# Patient Record
Sex: Female | Born: 1940 | Hispanic: No | Marital: Single
Health system: Southern US, Community
[De-identification: ages and names within clinical notes are randomized; demographics above are authoritative.]

## PROBLEM LIST (undated history)

## (undated) DIAGNOSIS — J9621 Acute and chronic respiratory failure with hypoxia: Secondary | ICD-10-CM

## (undated) DIAGNOSIS — I609 Nontraumatic subarachnoid hemorrhage, unspecified: Secondary | ICD-10-CM

## (undated) DIAGNOSIS — J189 Pneumonia, unspecified organism: Secondary | ICD-10-CM

## (undated) DIAGNOSIS — Z930 Tracheostomy status: Secondary | ICD-10-CM

---

## 2019-11-09 ENCOUNTER — Other Ambulatory Visit (HOSPITAL_COMMUNITY): Payer: Medicaid Other

## 2019-11-09 ENCOUNTER — Encounter: Payer: Self-pay | Admitting: Internal Medicine

## 2019-11-09 ENCOUNTER — Inpatient Hospital Stay
Admission: EM | Admit: 2019-11-09 | Discharge: 2019-12-02 | Disposition: A | Discharge status: Skilled Nursing Facility | Payer: Medicaid Other | Source: Other Acute Inpatient Hospital | Attending: Internal Medicine | Admitting: Internal Medicine

## 2019-11-09 DIAGNOSIS — Z930 Tracheostomy status: Secondary | ICD-10-CM

## 2019-11-09 DIAGNOSIS — I609 Nontraumatic subarachnoid hemorrhage, unspecified: Secondary | ICD-10-CM

## 2019-11-09 DIAGNOSIS — R52 Pain, unspecified: Secondary | ICD-10-CM

## 2019-11-09 DIAGNOSIS — K9423 Gastrostomy malfunction: Secondary | ICD-10-CM

## 2019-11-09 DIAGNOSIS — J9621 Acute and chronic respiratory failure with hypoxia: Secondary | ICD-10-CM | POA: Diagnosis not present

## 2019-11-09 DIAGNOSIS — Z931 Gastrostomy status: Secondary | ICD-10-CM

## 2019-11-09 DIAGNOSIS — J189 Pneumonia, unspecified organism: Secondary | ICD-10-CM | POA: Diagnosis not present

## 2019-11-09 HISTORY — DX: Nontraumatic subarachnoid hemorrhage, unspecified: I60.9

## 2019-11-09 HISTORY — DX: Pneumonia, unspecified organism: J18.9

## 2019-11-09 HISTORY — DX: Tracheostomy status: Z93.0

## 2019-11-09 HISTORY — DX: Acute and chronic respiratory failure with hypoxia: J96.21

## 2019-11-09 LAB — URINALYSIS, ROUTINE W REFLEX MICROSCOPIC
Bacteria, UA: NONE SEEN
Bilirubin Urine: NEGATIVE
Glucose, UA: NEGATIVE mg/dL
Hgb urine dipstick: NEGATIVE
Ketones, ur: NEGATIVE mg/dL
Leukocytes,Ua: NEGATIVE
Nitrite: NEGATIVE
Protein, ur: 100 mg/dL — AB
Specific Gravity, Urine: 1.021 (ref 1.005–1.030)
pH: 7 (ref 5.0–8.0)

## 2019-11-09 LAB — CBC
HCT: 31.8 % — ABNORMAL LOW (ref 36.0–46.0)
Hemoglobin: 9.7 g/dL — ABNORMAL LOW (ref 12.0–15.0)
MCH: 30.5 pg (ref 26.0–34.0)
MCHC: 30.5 g/dL (ref 30.0–36.0)
MCV: 100 fL (ref 80.0–100.0)
Platelets: 339 10*3/uL (ref 150–400)
RBC: 3.18 MIL/uL — ABNORMAL LOW (ref 3.87–5.11)
RDW: 13.9 % (ref 11.5–15.5)
WBC: 7.5 10*3/uL (ref 4.0–10.5)
nRBC: 0 % (ref 0.0–0.2)

## 2019-11-09 LAB — COMPREHENSIVE METABOLIC PANEL
ALT: 69 U/L — ABNORMAL HIGH (ref 0–44)
AST: 60 U/L — ABNORMAL HIGH (ref 15–41)
Albumin: 2.1 g/dL — ABNORMAL LOW (ref 3.5–5.0)
Alkaline Phosphatase: 117 U/L (ref 38–126)
Anion gap: 8 (ref 5–15)
BUN: 25 mg/dL — ABNORMAL HIGH (ref 8–23)
CO2: 28 mmol/L (ref 22–32)
Calcium: 9.4 mg/dL (ref 8.9–10.3)
Chloride: 105 mmol/L (ref 98–111)
Creatinine, Ser: 0.56 mg/dL (ref 0.44–1.00)
GFR calc Af Amer: >60 mL/min (ref >60)
GFR calc non Af Amer: >60 mL/min (ref >60)
Glucose, Bld: 108 mg/dL — ABNORMAL HIGH (ref 70–99)
Potassium: 4.3 mmol/L (ref 3.5–5.1)
Sodium: 141 mmol/L (ref 135–145)
Total Bilirubin: 0.6 mg/dL (ref 0.3–1.2)
Total Protein: 5.4 g/dL — ABNORMAL LOW (ref 6.5–8.1)

## 2019-11-09 LAB — PROTIME-INR
INR: 1.1 (ref 0.8–1.2)
Prothrombin Time: 13.9 seconds (ref 11.4–15.2)

## 2019-11-09 LAB — APTT: aPTT: 31 seconds (ref 24–36)

## 2019-11-09 LAB — TSH: TSH: 0.63 u[IU]/mL (ref 0.350–4.500)

## 2019-11-09 MED ORDER — IOHEXOL 300 MG/ML  SOLN
50.0000 mL | Freq: Once | INTRAMUSCULAR | Status: AC | PRN
  Administered 2019-11-09: 50 mL

## 2019-11-09 NOTE — Consult Note (Signed)
Pulmonary Critical Care Medicine [facility] [place]  PULMONARY SERVICE  Date of Service: 11/09/2019  PULMONARY CRITICAL CARE CONSULT   Stacy Dawson  NAT:[id number]  DOB: 09/29/1941   DOA: 11/09/2019  Referring Physician: Carron Curie, MD  HPI: Stacy Dawson is a 79 y.o. female seen for follow up of Acute on Chronic Respiratory Failure.  Patient has multiple medical problems including respiratory failure chronic anemia blindness depression hypertension hyperlipidemia stroke came into the hospital because of mental status changes.  The patient and eventually was found to have a subarachnoid hemorrhage.  She underwent coiling and embolization.  Patient also received a VP shunt for increased intracranial pressure.  Postprocedure she was noted to be wean off the ventilator.  Patient ended up having to have a tracheostomy done and has since then been slowly advancing on the wean to T-bar.  Patient because of the anticipated prolonged tracheostomy was transferred to this facility for further management.  Other issues at the other facility including uncontrolled high blood pressure along with the subarachnoid hemorrhage which has been requiring labetalol.  Now appears to be doing a little bit better.  Review of Systems:  ROS performed and is unremarkable other than noted above.  Past medical history: Legally blind in the left eye Depression Anemia Hyperlipidemia Stroke Hypertension  Past surgical history:  Cerebral embolization Tracheostomy  Social history: Unknown smoker alcohol  Family history: Unknown  Allergies: Reviewed includes Benadryl Iodine Latex  Medications: Reviewed on Rounds  Physical Exam:  Vitals: Temperature 99.2 pulse 83 respiratory 26 blood pressure is 111/52 saturations 95%  Ventilator Settings on T collar with an FiO2 of 35%  . General: Comfortable at this time . Eyes: Grossly normal lids, irises & conjunctiva . ENT: grossly tongue is  normal . Neck: no obvious mass . Cardiovascular: S1-S2 normal no gallop or rub . Respiratory: Scattered rhonchi expansion is equal . Abdomen: Soft and nontender . Skin: no rash seen on limited exam . Musculoskeletal: not rigid . Psychiatric:unable to assess . Neurologic: no seizure no involuntary movements         Labs on Admission:  Basic Metabolic Panel: Recent Labs  Lab 11/09/19 0839  NA 141  K 4.3  CL 105  CO2 28  GLUCOSE 108*  BUN 25*  CREATININE 0.56  CALCIUM 9.4    No results for input(s): PHART, PCO2ART, PO2ART, HCO3, O2SAT in the last 168 hours.  Liver Function Tests: Recent Labs  Lab 11/09/19 0839  AST 60*  ALT 69*  ALKPHOS 117  BILITOT 0.6  PROT 5.4*  ALBUMIN 2.1*   No results for input(s): LIPASE, AMYLASE in the last 168 hours. No results for input(s): AMMONIA in the last 168 hours.  CBC: Recent Labs  Lab 11/09/19 0839  WBC 7.5  HGB 9.7*  HCT 31.8*  MCV 100.0  PLT 339    Cardiac Enzymes: No results for input(s): CKTOTAL, CKMB, CKMBINDEX, TROPONINI in the last 168 hours.  BNP (last 3 results) No results for input(s): BNP in the last 8760 hours.  ProBNP (last 3 results) No results for input(s): PROBNP in the last 8760 hours.   Radiological Exams on Admission: DG ABDOMEN PEG TUBE LOCATION  Result Date: 11/09/2019 CLINICAL DATA:  Check position of gastrostomy post administration 50 mL Omnipaque 300 EXAM: ABDOMEN - 1 VIEW COMPARISON:  None FINDINGS: Contrast is in the decompressed stomach and proximal duodenum. No evidence of extravasation. Gastrostomy tube projects in expected location. Cholecystectomy clips. Lower lumbar fixation hardware. Normal  bowel gas pattern. Presumed VP shunt tubing coils in the right lateral abdomen. IMPRESSION: Gastrostomy tube in expected location.  No extravasation. Electronically Signed   By: Lucrezia Europe M.D.   On: 11/09/2019 07:24   DG Chest Port 1 View  Result Date: 11/09/2019 CLINICAL DATA:   Trach,respiratory failure^52mL OMNIPAQUE IOHEXOL 300 MG/ML SOLN EXAM: PORTABLE CHEST - 1 VIEW COMPARISON:  none FINDINGS: Tracheostomy tip 3.8 cm above carina. Right arm PICC to the cavoatrial junction. Presumed VP shunt tubing projects over the right hemithorax. Low lung volumes. Patchy interstitial and airspace opacities in the lung bases left greater than right. Heart size upper limits normal for technique. Blunting of the right lateral costophrenic angle. No pneumothorax. Left shoulder arthroplasty hardware partially visualized. Fracture deformity of the proximal right humerus. IMPRESSION: Bibasilar opacities left greater than right, with possible small right pleural effusion. Electronically Signed   By: Lucrezia Europe M.D.   On: 11/09/2019 07:26    Assessment/Plan Active Problems:   Acute on chronic respiratory failure with hypoxia (HCC)   Healthcare-associated pneumonia   Nontraumatic subarachnoid hemorrhage ([place])   Tracheostomy status ([place])   1. Acute on chronic respiratory failure with hypoxia plan is going to be to continue with weaning on T collar.  Patient secretions are fair to moderate will be continued to be monitored. 2. Possible healthcare associated pneumonia patient has basilar opacities noted on the last chest x-ray along with trace effusions we will continue to watch this closely she does have a low-grade fever noted also. 3. Subarachnoid hemorrhage status post coil embolization we will continue with supportive care will need physical therapy as tolerated. 4. Tracheostomy status right now she is not responsive and not sure if we can be able to be able to fully decannulate this patient will need to monitor see how she does during the course stay.  I have personally seen and evaluated the patient, evaluated laboratory and imaging results, formulated the assessment and plan and placed orders. The Patient requires high complexity decision making with multiple systems involvement.  Case  was discussed on Rounds with the Respiratory Therapy Director and the Respiratory staff Time Spent 54minutes  Jaser Fullen A Tanga Gloor, MD [facility] Pulmonary Critical Care Medicine Sleep Medicine

## 2019-11-10 DIAGNOSIS — I609 Nontraumatic subarachnoid hemorrhage, unspecified: Secondary | ICD-10-CM | POA: Diagnosis not present

## 2019-11-10 DIAGNOSIS — Z930 Tracheostomy status: Secondary | ICD-10-CM | POA: Diagnosis not present

## 2019-11-10 DIAGNOSIS — J9621 Acute and chronic respiratory failure with hypoxia: Secondary | ICD-10-CM | POA: Diagnosis not present

## 2019-11-10 DIAGNOSIS — J189 Pneumonia, unspecified organism: Secondary | ICD-10-CM | POA: Diagnosis not present

## 2019-11-10 NOTE — Progress Notes (Signed)
Pulmonary Critical Care Medicine [facility] [place]   PULMONARY CRITICAL CARE SERVICE  PROGRESS NOTE  Date of Service: 11/10/2019  Stacy Dawson  TMA:[id number]  DOB: Dec 02, 1940   DOA: 11/09/2019  Referring Physician: Carron Curie, MD  HPI: Stacy Dawson is a 79 y.o. female seen for follow up of Acute on Chronic Respiratory Failure.  Patient right now is on T collar has been on 28% FiO2 has moderate amount of secretions due for trach change also today  Medications: Reviewed on Rounds  Physical Exam:  Vitals: Temperature 98.4 pulse 87 respiratory rate 26 blood pressure is 138/81 saturations 93%  Ventilator Settings on T collar FiO2 is 28%  . General: Comfortable at this time . Eyes: Grossly normal lids, irises & conjunctiva . ENT: grossly tongue is normal . Neck: no obvious mass . Cardiovascular: S1 S2 normal no gallop . Respiratory: Scattered rhonchi expansion is equal . Abdomen: soft . Skin: no rash seen on limited exam . Musculoskeletal: not rigid . Psychiatric:unable to assess . Neurologic: no seizure no involuntary movements         Lab Data:   Basic Metabolic Panel: Recent Labs  Lab 11/09/19 0839  NA 141  K 4.3  CL 105  CO2 28  GLUCOSE 108*  BUN 25*  CREATININE 0.56  CALCIUM 9.4    ABG: No results for input(s): PHART, PCO2ART, PO2ART, HCO3, O2SAT in the last 168 hours.  Liver Function Tests: Recent Labs  Lab 11/09/19 0839  AST 60*  ALT 69*  ALKPHOS 117  BILITOT 0.6  PROT 5.4*  ALBUMIN 2.1*   No results for input(s): LIPASE, AMYLASE in the last 168 hours. No results for input(s): AMMONIA in the last 168 hours.  CBC: Recent Labs  Lab 11/09/19 0839  WBC 7.5  HGB 9.7*  HCT 31.8*  MCV 100.0  PLT 339    Cardiac Enzymes: No results for input(s): CKTOTAL, CKMB, CKMBINDEX, TROPONINI in the last 168 hours.  BNP (last 3 results) No results for input(s): BNP in the last 8760 hours.  ProBNP (last 3 results) No results for  input(s): PROBNP in the last 8760 hours.  Radiological Exams: DG ABDOMEN PEG TUBE LOCATION  Result Date: 11/09/2019 CLINICAL DATA:  Check position of gastrostomy post administration 50 mL Omnipaque 300 EXAM: ABDOMEN - 1 VIEW COMPARISON:  None FINDINGS: Contrast is in the decompressed stomach and proximal duodenum. No evidence of extravasation. Gastrostomy tube projects in expected location. Cholecystectomy clips. Lower lumbar fixation hardware. Normal bowel gas pattern. Presumed VP shunt tubing coils in the right lateral abdomen. IMPRESSION: Gastrostomy tube in expected location.  No extravasation. Electronically Signed   By: Corlis Leak M.D.   On: 11/09/2019 07:24   DG Chest Port 1 View  Result Date: 11/09/2019 CLINICAL DATA:  Trach,respiratory failure^56mL OMNIPAQUE IOHEXOL 300 MG/ML SOLN EXAM: PORTABLE CHEST - 1 VIEW COMPARISON:  none FINDINGS: Tracheostomy tip 3.8 cm above carina. Right arm PICC to the cavoatrial junction. Presumed VP shunt tubing projects over the right hemithorax. Low lung volumes. Patchy interstitial and airspace opacities in the lung bases left greater than right. Heart size upper limits normal for technique. Blunting of the right lateral costophrenic angle. No pneumothorax. Left shoulder arthroplasty hardware partially visualized. Fracture deformity of the proximal right humerus. IMPRESSION: Bibasilar opacities left greater than right, with possible small right pleural effusion. Electronically Signed   By: Corlis Leak M.D.   On: 11/09/2019 07:26    Assessment/Plan Active Problems:   Acute on  chronic respiratory failure with hypoxia (HCC)   Healthcare-associated pneumonia   Nontraumatic subarachnoid hemorrhage ([place])   Tracheostomy status ([place])   1. Acute on chronic respiratory failure with hypoxia patient right now is on T collar good saturations.  Noted however has moderate amount of secretions.  As discussed above plan is to go ahead and change the trach out to a cuffless  trach. 2. Healthcare associated pneumonia treated slowly improving chest x-ray reviewed showing bibasilar opacities which we need to continue to follow 3. Nontraumatic subarachnoid hemorrhage no improvement 4. Tracheostomy continue with supportive care and change out today as already mentioned above   I have personally seen and evaluated the patient, evaluated laboratory and imaging results, formulated the assessment and plan and placed orders. The Patient requires high complexity decision making with multiple systems involvement.  Rounds were done with the Respiratory Therapy Director and Staff therapists and discussed with nursing staff also.  Allyne Gee, MD [facility] Pulmonary Critical Care Medicine Sleep Medicine

## 2019-11-11 DIAGNOSIS — J9621 Acute and chronic respiratory failure with hypoxia: Secondary | ICD-10-CM | POA: Diagnosis not present

## 2019-11-11 DIAGNOSIS — J189 Pneumonia, unspecified organism: Secondary | ICD-10-CM | POA: Diagnosis not present

## 2019-11-11 DIAGNOSIS — I609 Nontraumatic subarachnoid hemorrhage, unspecified: Secondary | ICD-10-CM | POA: Diagnosis not present

## 2019-11-11 DIAGNOSIS — Z930 Tracheostomy status: Secondary | ICD-10-CM | POA: Diagnosis not present

## 2019-11-11 NOTE — Progress Notes (Addendum)
Pulmonary Critical Care Medicine [facility] [place]   PULMONARY CRITICAL CARE SERVICE  PROGRESS NOTE  Date of Service: 11/11/2019  [name]  PFX:[id number]  DOB: May 14, 1941   DOA: 11/09/2019  Referring Physician: Carron Curie, MD  HPI: Stacy Dawson is a 79 y.o. female seen for follow up of Acute on Chronic Respiratory Failure.  Patient is on 20% aerosol trach collar satting well no fever distress.  Medications: Reviewed on Rounds  Physical Exam:  Vitals: Pulse 80 respirations 22 BP 112/82 O2 sat 98% temp 98.1  Ventilator Settings ATC 28%  . General: Comfortable at this time . Eyes: Grossly normal lids, irises & conjunctiva . ENT: grossly tongue is normal . Neck: no obvious mass . Cardiovascular: S1 S2 normal no gallop . Respiratory: No rales or rhonchi noted . Abdomen: soft . Skin: no rash seen on limited exam . Musculoskeletal: not rigid . Psychiatric:unable to assess . Neurologic: no seizure no involuntary movements         Lab Data:   Basic Metabolic Panel: Recent Labs  Lab 11/09/19 0839  NA 141  K 4.3  CL 105  CO2 28  GLUCOSE 108*  BUN 25*  CREATININE 0.56  CALCIUM 9.4    ABG: No results for input(s): PHART, PCO2ART, PO2ART, HCO3, O2SAT in the last 168 hours.  Liver Function Tests: Recent Labs  Lab 11/09/19 0839  AST 60*  ALT 69*  ALKPHOS 117  BILITOT 0.6  PROT 5.4*  ALBUMIN 2.1*   No results for input(s): LIPASE, AMYLASE in the last 168 hours. No results for input(s): AMMONIA in the last 168 hours.  CBC: Recent Labs  Lab 11/09/19 0839  WBC 7.5  HGB 9.7*  HCT 31.8*  MCV 100.0  PLT 339    Cardiac Enzymes: No results for input(s): CKTOTAL, CKMB, CKMBINDEX, TROPONINI in the last 168 hours.  BNP (last 3 results) No results for input(s): BNP in the last 8760 hours.  ProBNP (last 3 results) No results for input(s): PROBNP in the last 8760 hours.  Radiological Exams: No results found.  Assessment/Plan Active  Problems:   Acute on chronic respiratory failure with hypoxia (HCC)   Healthcare-associated pneumonia   Nontraumatic subarachnoid hemorrhage (HCC)   Tracheostomy status (HCC)   1. Acute on chronic respiratory failure with hypoxia patient continues on 20% aerosol trach collar well no fever continue supportive measures pulmonary toilet at this time. 2. Healthcare associated pneumonia treated slowly improving chest x-ray reviewed showing bibasilar opacities which we need to continue to follow 3. Nontraumatic subarachnoid hemorrhage no improvement 4. Tracheostomy continue with supportive care and change out today as already mentioned above   I have personally seen and evaluated the patient, evaluated laboratory and imaging results, formulated the assessment and plan and placed orders. The Patient requires high complexity decision making with multiple systems involvement.  Rounds were done with the Respiratory Therapy Director and Staff therapists and discussed with nursing staff also.  Yevonne Pax, MD [facility] Pulmonary Critical Care Medicine Sleep Medicine

## 2019-11-12 DIAGNOSIS — Z930 Tracheostomy status: Secondary | ICD-10-CM | POA: Diagnosis not present

## 2019-11-12 DIAGNOSIS — J9621 Acute and chronic respiratory failure with hypoxia: Secondary | ICD-10-CM | POA: Diagnosis not present

## 2019-11-12 DIAGNOSIS — I609 Nontraumatic subarachnoid hemorrhage, unspecified: Secondary | ICD-10-CM | POA: Diagnosis not present

## 2019-11-12 DIAGNOSIS — J189 Pneumonia, unspecified organism: Secondary | ICD-10-CM | POA: Diagnosis not present

## 2019-11-12 NOTE — Progress Notes (Addendum)
Pulmonary Critical Care Medicine [facility] [place]   PULMONARY CRITICAL CARE SERVICE  PROGRESS NOTE  Date of Service: 11/12/2019  Stacy Dawson  OVZ:[id number]  DOB: 03/23/1941   DOA: 11/09/2019  Referring Physician: Carron Curie, MD  HPI: Stacy Dawson is a 79 y.o. female seen for follow up of Acute on Chronic Respiratory Failure.  Patient is on 20% aerosol trach collar satting well no fever distress.  Medications: Reviewed on Rounds  Physical Exam:  Vitals: Pulse 79 respirations 18 BP 160/74 O2 sat 98% temp 97.1  Ventilator Settings ATC 28%  . General: Comfortable at this time . Eyes: Grossly normal lids, irises & conjunctiva . ENT: grossly tongue is normal . Neck: no obvious mass . Cardiovascular: S1 S2 normal no gallop . Respiratory: No rales rhonchi noted . Abdomen: soft . Skin: no rash seen on limited exam . Musculoskeletal: not rigid . Psychiatric:unable to assess . Neurologic: no seizure no involuntary movements         Lab Data:   Basic Metabolic Panel: Recent Labs  Lab 11/09/19 0839  NA 141  K 4.3  CL 105  CO2 28  GLUCOSE 108*  BUN 25*  CREATININE 0.56  CALCIUM 9.4    ABG: No results for input(s): PHART, PCO2ART, PO2ART, HCO3, O2SAT in the last 168 hours.  Liver Function Tests: Recent Labs  Lab 11/09/19 0839  AST 60*  ALT 69*  ALKPHOS 117  BILITOT 0.6  PROT 5.4*  ALBUMIN 2.1*   No results for input(s): LIPASE, AMYLASE in the last 168 hours. No results for input(s): AMMONIA in the last 168 hours.  CBC: Recent Labs  Lab 11/09/19 0839  WBC 7.5  HGB 9.7*  HCT 31.8*  MCV 100.0  PLT 339    Cardiac Enzymes: No results for input(s): CKTOTAL, CKMB, CKMBINDEX, TROPONINI in the last 168 hours.  BNP (last 3 results) No results for input(s): BNP in the last 8760 hours.  ProBNP (last 3 results) No results for input(s): PROBNP in the last 8760 hours.  Radiological Exams: No results found.  Assessment/Plan Active  Problems:   Acute on chronic respiratory failure with hypoxia (HCC)   Healthcare-associated pneumonia   Nontraumatic subarachnoid hemorrhage (HCC)   Tracheostomy status (HCC)   1. Acute on chronic respiratory failure with hypoxia patient continues on 20% aerosol trach collar well no fever continue supportive measures pulmonary toilet at this time. 2. Healthcare associated pneumonia treated slowly improving chest x-ray reviewed showing bibasilar opacities which we need to continue to follow 3. Nontraumatic subarachnoid hemorrhage no improvement 4. Tracheostomy continue with supportive care and change out today as already mentioned above   I have personally seen and evaluated the patient, evaluated laboratory and imaging results, formulated the assessment and plan and placed orders. The Patient requires high complexity decision making with multiple systems involvement.  Rounds were done with the Respiratory Therapy Director and Staff therapists and discussed with nursing staff also.  Yevonne Pax, MD [facility] Pulmonary Critical Care Medicine Sleep Medicine

## 2019-11-13 DIAGNOSIS — J189 Pneumonia, unspecified organism: Secondary | ICD-10-CM | POA: Diagnosis not present

## 2019-11-13 DIAGNOSIS — J9621 Acute and chronic respiratory failure with hypoxia: Secondary | ICD-10-CM | POA: Diagnosis not present

## 2019-11-13 DIAGNOSIS — I609 Nontraumatic subarachnoid hemorrhage, unspecified: Secondary | ICD-10-CM | POA: Diagnosis not present

## 2019-11-13 DIAGNOSIS — Z930 Tracheostomy status: Secondary | ICD-10-CM | POA: Diagnosis not present

## 2019-11-13 NOTE — Progress Notes (Addendum)
Pulmonary Critical Care Medicine [facility] [place]   PULMONARY CRITICAL CARE SERVICE  PROGRESS NOTE  Date of Service: 11/13/2019  Stacy Dawson  YTK:[id number]  DOB: 1941-04-28   DOA: 11/09/2019  Referring Physician: Carron Curie, MD  HPI: Stacy Dawson is a 79 y.o. female seen for follow up of Acute on Chronic Respiratory Failure.  Patient remains on aerosol trach collar 28% satting well using PMV.  Medications: Reviewed on Rounds  Physical Exam:  Vitals: Pulse 85 respirations 22 BP 135/70 O2 sat 97% temp 3  Ventilator Settings 28% ATC  . General: Comfortable at this time . Eyes: Grossly normal lids, irises & conjunctiva . ENT: grossly tongue is normal . Neck: no obvious mass . Cardiovascular: S1 S2 normal no gallop . Respiratory: No rales or rhonchi noted . Abdomen: soft . Skin: no rash seen on limited exam . Musculoskeletal: not rigid . Psychiatric:unable to assess . Neurologic: no seizure no involuntary movements         Lab Data:   Basic Metabolic Panel: Recent Labs  Lab 11/09/19 0839  NA 141  K 4.3  CL 105  CO2 28  GLUCOSE 108*  BUN 25*  CREATININE 0.56  CALCIUM 9.4    ABG: No results for input(s): PHART, PCO2ART, PO2ART, HCO3, O2SAT in the last 168 hours.  Liver Function Tests: Recent Labs  Lab 11/09/19 0839  AST 60*  ALT 69*  ALKPHOS 117  BILITOT 0.6  PROT 5.4*  ALBUMIN 2.1*   No results for input(s): LIPASE, AMYLASE in the last 168 hours. No results for input(s): AMMONIA in the last 168 hours.  CBC: Recent Labs  Lab 11/09/19 0839  WBC 7.5  HGB 9.7*  HCT 31.8*  MCV 100.0  PLT 339    Cardiac Enzymes: No results for input(s): CKTOTAL, CKMB, CKMBINDEX, TROPONINI in the last 168 hours.  BNP (last 3 results) No results for input(s): BNP in the last 8760 hours.  ProBNP (last 3 results) No results for input(s): PROBNP in the last 8760 hours.  Radiological Exams: No results found.  Assessment/Plan Active  Problems:   Acute on chronic respiratory failure with hypoxia (HCC)   Healthcare-associated pneumonia   Nontraumatic subarachnoid hemorrhage (HCC)   Tracheostomy status (HCC)   1. Acute on chronic respiratory failure with hypoxia patient continues on 28% aerosol trach collar well no fever continue supportive measures pulmonary toilet at this time. 2. Healthcare associated pneumonia treated slowly improving chest x-ray reviewed showing bibasilar opacities which we need to continue to follow 3. Nontraumatic subarachnoid hemorrhage no improvement 4. Tracheostomy continue with supportive care    I have personally seen and evaluated the patient, evaluated laboratory and imaging results, formulated the assessment and plan and placed orders. The Patient requires high complexity decision making with multiple systems involvement.  Rounds were done with the Respiratory Therapy Director and Staff therapists and discussed with nursing staff also.  Yevonne Pax, MD [facility] Pulmonary Critical Care Medicine Sleep Medicine

## 2019-11-14 DIAGNOSIS — Z930 Tracheostomy status: Secondary | ICD-10-CM | POA: Diagnosis not present

## 2019-11-14 DIAGNOSIS — J9621 Acute and chronic respiratory failure with hypoxia: Secondary | ICD-10-CM | POA: Diagnosis not present

## 2019-11-14 DIAGNOSIS — J189 Pneumonia, unspecified organism: Secondary | ICD-10-CM | POA: Diagnosis not present

## 2019-11-14 DIAGNOSIS — I609 Nontraumatic subarachnoid hemorrhage, unspecified: Secondary | ICD-10-CM | POA: Diagnosis not present

## 2019-11-14 LAB — CBC
HCT: 33.3 % — ABNORMAL LOW (ref 36.0–46.0)
Hemoglobin: 10.5 g/dL — ABNORMAL LOW (ref 12.0–15.0)
MCH: 29.8 pg (ref 26.0–34.0)
MCHC: 31.5 g/dL (ref 30.0–36.0)
MCV: 94.6 fL (ref 80.0–100.0)
Platelets: 436 10*3/uL — ABNORMAL HIGH (ref 150–400)
RBC: 3.52 MIL/uL — ABNORMAL LOW (ref 3.87–5.11)
RDW: 13.7 % (ref 11.5–15.5)
WBC: 7.3 10*3/uL (ref 4.0–10.5)
nRBC: 0 % (ref 0.0–0.2)

## 2019-11-14 LAB — COMPREHENSIVE METABOLIC PANEL
ALT: 68 U/L — ABNORMAL HIGH (ref 0–44)
AST: 44 U/L — ABNORMAL HIGH (ref 15–41)
Albumin: 2.3 g/dL — ABNORMAL LOW (ref 3.5–5.0)
Alkaline Phosphatase: 125 U/L (ref 38–126)
Anion gap: 12 (ref 5–15)
BUN: 15 mg/dL (ref 8–23)
CO2: 26 mmol/L (ref 22–32)
Calcium: 9.6 mg/dL (ref 8.9–10.3)
Chloride: 100 mmol/L (ref 98–111)
Creatinine, Ser: 0.54 mg/dL (ref 0.44–1.00)
GFR calc Af Amer: >60 mL/min (ref >60)
GFR calc non Af Amer: >60 mL/min (ref >60)
Glucose, Bld: 115 mg/dL — ABNORMAL HIGH (ref 70–99)
Potassium: 4 mmol/L (ref 3.5–5.1)
Sodium: 138 mmol/L (ref 135–145)
Total Bilirubin: 0.3 mg/dL (ref 0.3–1.2)
Total Protein: 5.3 g/dL — ABNORMAL LOW (ref 6.5–8.1)

## 2019-11-14 NOTE — Progress Notes (Signed)
Pulmonary Critical Care Medicine [facility] [place]   PULMONARY CRITICAL CARE SERVICE  PROGRESS NOTE  Date of Service: 11/14/2019  Stacy Dawson  [id number]:[id number]  DOB: 03-27-1941   DOA: 11/09/2019  Referring Physician: Carron Curie, MD  HPI: Stacy Dawson is a 79 y.o. female seen for follow up of Acute on Chronic Respiratory Failure.  Patient currently is on T collar has been on 28% FiO2 has been using the PMV without any distress at this time  Medications: Reviewed on Rounds  Physical Exam:  Vitals: Temperature is 97.2 pulse 87 respiratory rate 22 blood pressure is 153/98 saturations 97%  Ventilator Settings on T collar with an FiO2 of 28%  . General: Comfortable at this time . Eyes: Grossly normal lids, irises & conjunctiva . ENT: grossly tongue is normal . Neck: no obvious mass . Cardiovascular: S1 S2 normal no gallop . Respiratory: No rhonchi no rales are noted at this time . Abdomen: soft . Skin: no rash seen on limited exam . Musculoskeletal: not rigid . Psychiatric:unable to assess . Neurologic: no seizure no involuntary movements         Lab Data:   Basic Metabolic Panel: Recent Labs  Lab 11/09/19 0839 11/14/19 0720  NA 141 138  K 4.3 4.0  CL 105 100  CO2 28 26  GLUCOSE 108* 115*  BUN 25* 15  CREATININE 0.56 0.54  CALCIUM 9.4 9.6    ABG: No results for input(s): PHART, PCO2ART, PO2ART, HCO3, O2SAT in the last 168 hours.  Liver Function Tests: Recent Labs  Lab 11/09/19 0839 11/14/19 0720  AST 60* 44*  ALT 69* 68*  ALKPHOS 117 125  BILITOT 0.6 0.3  PROT 5.4* 5.3*  ALBUMIN 2.1* 2.3*   No results for input(s): LIPASE, AMYLASE in the last 168 hours. No results for input(s): AMMONIA in the last 168 hours.  CBC: Recent Labs  Lab 11/09/19 0839 11/14/19 0720  WBC 7.5 7.3  HGB 9.7* 10.5*  HCT 31.8* 33.3*  MCV 100.0 94.6  PLT 339 436*    Cardiac Enzymes: No results for input(s): CKTOTAL, CKMB, CKMBINDEX, TROPONINI in the  last 168 hours.  BNP (last 3 results) No results for input(s): BNP in the last 8760 hours.  ProBNP (last 3 results) No results for input(s): PROBNP in the last 8760 hours.  Radiological Exams: No results found.  Assessment/Plan Active Problems:   Acute on chronic respiratory failure with hypoxia (HCC)   Healthcare-associated pneumonia   Nontraumatic subarachnoid hemorrhage (HCC)   Tracheostomy status (HCC)   1. Acute on chronic respiratory failure with hypoxia plan is to continue with weaning on T collar continue using the PMV. 2. Healthcare associated pneumonia treated we will continue to monitor. 3. Nontraumatic intracranial hemorrhage no change 4. Tracheostomy remains in place tolerating PMV   I have personally seen and evaluated the patient, evaluated laboratory and imaging results, formulated the assessment and plan and placed orders. The Patient requires high complexity decision making with multiple systems involvement.  Rounds were done with the Respiratory Therapy Director and Staff therapists and discussed with nursing staff also.  Yevonne Pax, MD [facility] Pulmonary Critical Care Medicine Sleep Medicine

## 2019-11-15 DIAGNOSIS — I609 Nontraumatic subarachnoid hemorrhage, unspecified: Secondary | ICD-10-CM | POA: Diagnosis not present

## 2019-11-15 DIAGNOSIS — J9621 Acute and chronic respiratory failure with hypoxia: Secondary | ICD-10-CM | POA: Diagnosis not present

## 2019-11-15 DIAGNOSIS — Z930 Tracheostomy status: Secondary | ICD-10-CM | POA: Diagnosis not present

## 2019-11-15 DIAGNOSIS — J189 Pneumonia, unspecified organism: Secondary | ICD-10-CM | POA: Diagnosis not present

## 2019-11-15 NOTE — Progress Notes (Signed)
Pulmonary Critical Care Medicine [facility] [place]   PULMONARY CRITICAL CARE SERVICE  PROGRESS NOTE  Date of Service: 11/15/2019  Stacy Dawson  [id number]:[id number]  DOB: 1941-05-09   DOA: 11/09/2019  Referring Physician: Carron Curie, MD  HPI: Stacy Dawson is a 79 y.o. female seen for follow up of Acute on Chronic Respiratory Failure.  Patient currently is on T collar has been on 28% FiO2 using PMV without any distress at this time  Medications: Reviewed on Rounds  Physical Exam:  Vitals: Temperature is 97.6 pulse 84 respiratory rate 19 blood pressure is 145/50 saturations 100%  Ventilator Settings on T collar with an FiO2 of 28% using PMV  . General: Comfortable at this time . Eyes: Grossly normal lids, irises & conjunctiva . ENT: grossly tongue is normal . Neck: no obvious mass . Cardiovascular: S1 S2 normal no gallop . Respiratory: Coarse rhonchi expansion is equal . Abdomen: soft . Skin: no rash seen on limited exam . Musculoskeletal: not rigid . Psychiatric:unable to assess . Neurologic: no seizure no involuntary movements         Lab Data:   Basic Metabolic Panel: Recent Labs  Lab 11/09/19 0839 11/14/19 0720  NA 141 138  K 4.3 4.0  CL 105 100  CO2 28 26  GLUCOSE 108* 115*  BUN 25* 15  CREATININE 0.56 0.54  CALCIUM 9.4 9.6    ABG: No results for input(s): PHART, PCO2ART, PO2ART, HCO3, O2SAT in the last 168 hours.  Liver Function Tests: Recent Labs  Lab 11/09/19 0839 11/14/19 0720  AST 60* 44*  ALT 69* 68*  ALKPHOS 117 125  BILITOT 0.6 0.3  PROT 5.4* 5.3*  ALBUMIN 2.1* 2.3*   No results for input(s): LIPASE, AMYLASE in the last 168 hours. No results for input(s): AMMONIA in the last 168 hours.  CBC: Recent Labs  Lab 11/09/19 0839 11/14/19 0720  WBC 7.5 7.3  HGB 9.7* 10.5*  HCT 31.8* 33.3*  MCV 100.0 94.6  PLT 339 436*    Cardiac Enzymes: No results for input(s): CKTOTAL, CKMB, CKMBINDEX, TROPONINI in the last 168  hours.  BNP (last 3 results) No results for input(s): BNP in the last 8760 hours.  ProBNP (last 3 results) No results for input(s): PROBNP in the last 8760 hours.  Radiological Exams: No results found.  Assessment/Plan Active Problems:   Acute on chronic respiratory failure with hypoxia (HCC)   Healthcare-associated pneumonia   Nontraumatic subarachnoid hemorrhage (HCC)   Tracheostomy status (HCC)   1. Acute on chronic respiratory failure with hypoxia plan is to continue with T collar trials titrate oxygen as tolerated patient has been using the PMV 2. Healthcare associated pneumonia slow improvement we will continue to follow 3. Nontraumatic subarachnoid hemorrhage no change 4. Tracheostomy in place   I have personally seen and evaluated the patient, evaluated laboratory and imaging results, formulated the assessment and plan and placed orders. The Patient requires high complexity decision making with multiple systems involvement.  Rounds were done with the Respiratory Therapy Director and Staff therapists and discussed with nursing staff also.  Yevonne Pax, MD [facility] Pulmonary Critical Care Medicine Sleep Medicine

## 2019-11-16 DIAGNOSIS — J9621 Acute and chronic respiratory failure with hypoxia: Secondary | ICD-10-CM | POA: Diagnosis not present

## 2019-11-16 DIAGNOSIS — I609 Nontraumatic subarachnoid hemorrhage, unspecified: Secondary | ICD-10-CM | POA: Diagnosis not present

## 2019-11-16 DIAGNOSIS — J189 Pneumonia, unspecified organism: Secondary | ICD-10-CM | POA: Diagnosis not present

## 2019-11-16 DIAGNOSIS — Z930 Tracheostomy status: Secondary | ICD-10-CM | POA: Diagnosis not present

## 2019-11-16 NOTE — Progress Notes (Signed)
Pulmonary Critical Care Medicine [facility] [place]   PULMONARY CRITICAL CARE SERVICE  PROGRESS NOTE  Date of Service: 11/16/2019  Stacy Dawson  [id number]  DOB: 12-Mar-1941   DOA: 11/09/2019  Referring Physician: Carron Curie, MD  HPI: Stacy Dawson is a 79 y.o. female seen for follow up of Acute on Chronic Respiratory Failure.  Patient is currently on T collar has been on 28% FiO2 using PMV without any major distress noted at this time  Medications: Reviewed on Rounds  Physical Exam:  Vitals: Temperature is 97.6 pulse 83 respiratory 19 blood pressure is 145/58 saturations 99%  Ventilator Settings on T collar with an FiO2 of 28% at this time  . General: Comfortable at this time . Eyes: Grossly normal lids, irises & conjunctiva . ENT: grossly tongue is normal . Neck: no obvious mass . Cardiovascular: S1 S2 normal no gallop . Respiratory: No rhonchi no rales are noted at this time . Abdomen: soft . Skin: no rash seen on limited exam . Musculoskeletal: not rigid . Psychiatric:unable to assess . Neurologic: no seizure no involuntary movements         Lab Data:   Basic Metabolic Panel: Recent Labs  Lab 11/14/19 0720  NA 138  K 4.0  CL 100  CO2 26  GLUCOSE 115*  BUN 15  CREATININE 0.54  CALCIUM 9.6    ABG: No results for input(s): PHART, PCO2ART, PO2ART, HCO3, O2SAT in the last 168 hours.  Liver Function Tests: Recent Labs  Lab 11/14/19 0720  AST 44*  ALT 68*  ALKPHOS 125  BILITOT 0.3  PROT 5.3*  ALBUMIN 2.3*   No results for input(s): LIPASE, AMYLASE in the last 168 hours. No results for input(s): AMMONIA in the last 168 hours.  CBC: Recent Labs  Lab 11/14/19 0720  WBC 7.3  HGB 10.5*  HCT 33.3*  MCV 94.6  PLT 436*    Cardiac Enzymes: No results for input(s): CKTOTAL, CKMB, CKMBINDEX, TROPONINI in the last 168 hours.  BNP (last 3 results) No results for input(s): BNP in the last 8760 hours.  ProBNP (last 3 results) No  results for input(s): PROBNP in the last 8760 hours.  Radiological Exams: No results found.  Assessment/Plan Active Problems:   Acute on chronic respiratory failure with hypoxia (HCC)   Healthcare-associated pneumonia   Nontraumatic subarachnoid hemorrhage (HCC)   Tracheostomy status (HCC)   1. Acute on chronic respiratory failure with hypoxia plan is to continue with T collar trials patient is on 28% FiO2 good saturations are noted 2. Healthcare associated pneumonia treated we will continue present management 3. Nontraumatic intracranial hemorrhage at baseline continue with supportive care 4. Tracheostomy remains in place continue to monitor closely   I have personally seen and evaluated the patient, evaluated laboratory and imaging results, formulated the assessment and plan and placed orders. The Patient requires high complexity decision making with multiple systems involvement.  Rounds were done with the Respiratory Therapy Director and Staff therapists and discussed with nursing staff also.  Yevonne Pax, MD [facility] Pulmonary Critical Care Medicine Sleep Medicine

## 2019-11-17 DIAGNOSIS — J9621 Acute and chronic respiratory failure with hypoxia: Secondary | ICD-10-CM | POA: Diagnosis not present

## 2019-11-17 DIAGNOSIS — J189 Pneumonia, unspecified organism: Secondary | ICD-10-CM | POA: Diagnosis not present

## 2019-11-17 DIAGNOSIS — I609 Nontraumatic subarachnoid hemorrhage, unspecified: Secondary | ICD-10-CM | POA: Diagnosis not present

## 2019-11-17 DIAGNOSIS — Z930 Tracheostomy status: Secondary | ICD-10-CM | POA: Diagnosis not present

## 2019-11-17 NOTE — Progress Notes (Addendum)
Pulmonary Critical Care Medicine [facility] [place]   PULMONARY CRITICAL CARE SERVICE  PROGRESS NOTE  Date of Service: 11/17/2019  ACADIA THAMMAVONG  [id number]  DOB: Dec 05, 1940   DOA: 11/09/2019  Referring Physician: Carron Curie, MD  HPI: ELMARIE DEVLIN is a 79 y.o. female seen for follow up of Acute on Chronic Respiratory Failure.  Patient currently is on T collar and is using the PMV  Medications: Reviewed on Rounds  Physical Exam:  Vitals: Temperature is 98.7 pulse 85 respiratory 18 blood pressure is 160/84 saturations 99%  Ventilator Settings off the ventilator on T collar using PMV  . General: Comfortable at this time . Eyes: Grossly normal lids, irises & conjunctiva . ENT: grossly tongue is normal . Neck: no obvious mass . Cardiovascular: S1 S2 normal no gallop . Respiratory: No rhonchi no rales are noted at this time . Abdomen: soft . Skin: no rash seen on limited exam . Musculoskeletal: not rigid . Psychiatric:unable to assess . Neurologic: no seizure no involuntary movements         Lab Data:   Basic Metabolic Panel: Recent Labs  Lab 11/14/19 0720  NA 138  K 4.0  CL 100  CO2 26  GLUCOSE 115*  BUN 15  CREATININE 0.54  CALCIUM 9.6    ABG: No results for input(s): PHART, PCO2ART, PO2ART, HCO3, O2SAT in the last 168 hours.  Liver Function Tests: Recent Labs  Lab 11/14/19 0720  AST 44*  ALT 68*  ALKPHOS 125  BILITOT 0.3  PROT 5.3*  ALBUMIN 2.3*   No results for input(s): LIPASE, AMYLASE in the last 168 hours. No results for input(s): AMMONIA in the last 168 hours.  CBC: Recent Labs  Lab 11/14/19 0720  WBC 7.3  HGB 10.5*  HCT 33.3*  MCV 94.6  PLT 436*    Cardiac Enzymes: No results for input(s): CKTOTAL, CKMB, CKMBINDEX, TROPONINI in the last 168 hours.  BNP (last 3 results) No results for input(s): BNP in the last 8760 hours.  ProBNP (last 3 results) No results for input(s): PROBNP in the last 8760  hours.  Radiological Exams: No results found.  Assessment/Plan Active Problems:   Acute on chronic respiratory failure with hypoxia (HCC)   Healthcare-associated pneumonia   Nontraumatic subarachnoid hemorrhage (HCC)   Tracheostomy status (HCC)   1. Acute on chronic respiratory failure with hypoxia plan is to continue to advance the weaning will try capping trials now 2. Healthcare associated pneumonia treated 3. Nontraumatic intracranial hemorrhage no change supportive care 4. Tracheostomy remains in place   I have personally seen and evaluated the patient, evaluated laboratory and imaging results, formulated the assessment and plan and placed orders. The Patient requires high complexity decision making with multiple systems involvement.  Rounds were done with the Respiratory Therapy Director and Staff therapists and discussed with nursing staff also.  Yevonne Pax, MD [facility] Pulmonary Critical Care Medicine Sleep Medicine

## 2019-11-18 DIAGNOSIS — J9621 Acute and chronic respiratory failure with hypoxia: Secondary | ICD-10-CM | POA: Diagnosis not present

## 2019-11-18 DIAGNOSIS — J189 Pneumonia, unspecified organism: Secondary | ICD-10-CM | POA: Diagnosis not present

## 2019-11-18 DIAGNOSIS — I609 Nontraumatic subarachnoid hemorrhage, unspecified: Secondary | ICD-10-CM | POA: Diagnosis not present

## 2019-11-18 DIAGNOSIS — Z930 Tracheostomy status: Secondary | ICD-10-CM | POA: Diagnosis not present

## 2019-11-18 LAB — BLOOD GAS, ARTERIAL
Acid-Base Excess: 4.8 mmol/L — ABNORMAL HIGH (ref 0.0–2.0)
Bicarbonate: 29 mmol/L — ABNORMAL HIGH (ref 20.0–28.0)
FIO2: 21
O2 Saturation: 97.8 %
Patient temperature: 36
pCO2 arterial: 42 mmHg (ref 32.0–48.0)
pH, Arterial: 7.448 (ref 7.350–7.450)
pO2, Arterial: 84.6 mmHg (ref 83.0–108.0)

## 2019-11-18 NOTE — Progress Notes (Signed)
Pulmonary Critical Care Medicine [facility] [place]   PULMONARY CRITICAL CARE SERVICE  PROGRESS NOTE  Date of Service: 11/18/2019  Stacy Dawson  [id number]:[id number]  DOB: 09-Sep-1941   DOA: 11/09/2019  Referring Physician: Carron Curie, MD  HPI: Stacy Dawson is a 79 y.o. female seen for follow up of Acute on Chronic Respiratory Failure.  Patient is capping right now is on room air is comfortable right now without distress secretions are still somewhat of an issue  Medications: Reviewed on Rounds  Physical Exam:  Vitals: Temperature 98.3 pulse 88 respiratory rate 22 blood pressure is 121/67 saturations 97%  Ventilator Settings capping on room air  . General: Comfortable at this time . Eyes: Grossly normal lids, irises & conjunctiva . ENT: grossly tongue is normal . Neck: no obvious mass . Cardiovascular: S1 S2 normal no gallop . Respiratory: No rhonchi no rales . Abdomen: soft . Skin: no rash seen on limited exam . Musculoskeletal: not rigid . Psychiatric:unable to assess . Neurologic: no seizure no involuntary movements         Lab Data:   Basic Metabolic Panel: Recent Labs  Lab 11/14/19 0720  NA 138  K 4.0  CL 100  CO2 26  GLUCOSE 115*  BUN 15  CREATININE 0.54  CALCIUM 9.6    ABG: No results for input(s): PHART, PCO2ART, PO2ART, HCO3, O2SAT in the last 168 hours.  Liver Function Tests: Recent Labs  Lab 11/14/19 0720  AST 44*  ALT 68*  ALKPHOS 125  BILITOT 0.3  PROT 5.3*  ALBUMIN 2.3*   No results for input(s): LIPASE, AMYLASE in the last 168 hours. No results for input(s): AMMONIA in the last 168 hours.  CBC: Recent Labs  Lab 11/14/19 0720  WBC 7.3  HGB 10.5*  HCT 33.3*  MCV 94.6  PLT 436*    Cardiac Enzymes: No results for input(s): CKTOTAL, CKMB, CKMBINDEX, TROPONINI in the last 168 hours.  BNP (last 3 results) No results for input(s): BNP in the last 8760 hours.  ProBNP (last 3 results) No results for input(s):  PROBNP in the last 8760 hours.  Radiological Exams: No results found.  Assessment/Plan Active Problems:   Acute on chronic respiratory failure with hypoxia (HCC)   Healthcare-associated pneumonia   Nontraumatic subarachnoid hemorrhage (HCC)   Tracheostomy status (HCC)   1. Acute on chronic respiratory failure with hypoxia plan is to continue with capping trials as tolerated continue secretion management pulmonary toilet 2. Healthcare associated pneumonia treated clinically improved 3. Nontraumatic intracranial hemorrhage no change 4. Tracheostomy remains in place   I have personally seen and evaluated the patient, evaluated laboratory and imaging results, formulated the assessment and plan and placed orders. The Patient requires high complexity decision making with multiple systems involvement.  Rounds were done with the Respiratory Therapy Director and Staff therapists and discussed with nursing staff also.  Yevonne Pax, MD [facility] Pulmonary Critical Care Medicine Sleep Medicine

## 2019-11-19 DIAGNOSIS — I609 Nontraumatic subarachnoid hemorrhage, unspecified: Secondary | ICD-10-CM | POA: Diagnosis not present

## 2019-11-19 DIAGNOSIS — Z930 Tracheostomy status: Secondary | ICD-10-CM | POA: Diagnosis not present

## 2019-11-19 DIAGNOSIS — J189 Pneumonia, unspecified organism: Secondary | ICD-10-CM | POA: Diagnosis not present

## 2019-11-19 DIAGNOSIS — J9621 Acute and chronic respiratory failure with hypoxia: Secondary | ICD-10-CM | POA: Diagnosis not present

## 2019-11-19 NOTE — Progress Notes (Addendum)
Pulmonary Critical Care Medicine [facility] [place]   PULMONARY CRITICAL CARE SERVICE  PROGRESS NOTE  Date of Service: 11/19/2019  Stacy Dawson  [id number]  DOB: 12-23-1940   DOA: 11/09/2019  Referring Physician: Carron Curie, MD  HPI: Stacy Dawson is a 79 y.o. female seen for follow up of Acute on Chronic Respiratory Failure.  Patient remains on aerosol trach collar 1% FiO2 using PMV with no difficulty satting well.  Medications: Reviewed on Rounds  Physical Exam:  Vitals: Pulse 79 respirations 23 BP 121/74 O2 sat 92% temp 97.8  Ventilator Settings ATC 21%  . General: Comfortable at this time . Eyes: Grossly normal lids, irises & conjunctiva . ENT: grossly tongue is normal . Neck: no obvious mass . Cardiovascular: S1 S2 normal no gallop . Respiratory: No rales or rhonchi noted . Abdomen: soft . Skin: no rash seen on limited exam . Musculoskeletal: not rigid . Psychiatric:unable to assess . Neurologic: no seizure no involuntary movements         Lab Data:   Basic Metabolic Panel: Recent Labs  Lab 11/14/19 0720  NA 138  K 4.0  CL 100  CO2 26  GLUCOSE 115*  BUN 15  CREATININE 0.54  CALCIUM 9.6    ABG: Recent Labs  Lab 11/18/19 1315  PHART 7.448  PCO2ART 42.0  PO2ART 84.6  HCO3 29.0*  O2SAT 97.8    Liver Function Tests: Recent Labs  Lab 11/14/19 0720  AST 44*  ALT 68*  ALKPHOS 125  BILITOT 0.3  PROT 5.3*  ALBUMIN 2.3*   No results for input(s): LIPASE, AMYLASE in the last 168 hours. No results for input(s): AMMONIA in the last 168 hours.  CBC: Recent Labs  Lab 11/14/19 0720  WBC 7.3  HGB 10.5*  HCT 33.3*  MCV 94.6  PLT 436*    Cardiac Enzymes: No results for input(s): CKTOTAL, CKMB, CKMBINDEX, TROPONINI in the last 168 hours.  BNP (last 3 results) No results for input(s): BNP in the last 8760 hours.  ProBNP (last 3 results) No results for input(s): PROBNP in the last 8760 hours.  Radiological Exams: No  results found.  Assessment/Plan Active Problems:   Acute on chronic respiratory failure with hypoxia (HCC)   Healthcare-associated pneumonia   Nontraumatic subarachnoid hemorrhage (HCC)   Tracheostomy status (HCC)   1. Acute on chronic respiratory failure with hypoxia plan is to continue with capping trials as tolerated continue secretion management pulmonary toilet 2. Healthcare associated pneumonia treated clinically improved 3. Nontraumatic intracranial hemorrhage no change 4. Tracheostomy remains in place   I have personally seen and evaluated the patient, evaluated laboratory and imaging results, formulated the assessment and plan and placed orders. The Patient requires high complexity decision making with multiple systems involvement.  Rounds were done with the Respiratory Therapy Director and Staff therapists and discussed with nursing staff also.  Yevonne Pax, MD [facility] Pulmonary Critical Care Medicine Sleep Medicine

## 2019-11-20 ENCOUNTER — Other Ambulatory Visit (HOSPITAL_COMMUNITY): Payer: Medicaid Other

## 2019-11-20 DIAGNOSIS — I609 Nontraumatic subarachnoid hemorrhage, unspecified: Secondary | ICD-10-CM | POA: Diagnosis not present

## 2019-11-20 DIAGNOSIS — J9621 Acute and chronic respiratory failure with hypoxia: Secondary | ICD-10-CM | POA: Diagnosis not present

## 2019-11-20 DIAGNOSIS — Z930 Tracheostomy status: Secondary | ICD-10-CM | POA: Diagnosis not present

## 2019-11-20 DIAGNOSIS — J189 Pneumonia, unspecified organism: Secondary | ICD-10-CM | POA: Diagnosis not present

## 2019-11-20 MED ORDER — IOHEXOL 300 MG/ML  SOLN: 50.0000 mL | Freq: Once | INTRAMUSCULAR | Status: DC | PRN

## 2019-11-20 NOTE — Progress Notes (Addendum)
Pulmonary Critical Care Medicine [facility] [place]   PULMONARY CRITICAL CARE SERVICE  PROGRESS NOTE  Date of Service: 11/20/2019  Stacy Dawson  [id number]  DOB: Sep 26, 1941   DOA: 11/09/2019  Referring Physician: Carron Curie, MD  HPI: Stacy Dawson is a 79 y.o. female seen for follow up of Acute on Chronic Respiratory Failure.  Patient continues on 21% aerosol trach collar satting well at this time continues to do well no fever distress.  Medications: Reviewed on Rounds  Physical Exam:  Vitals: Pulse 81 respirations 18 BP 149/86 O2 sat 100% temp 97.3  Ventilator Settings 21% ATC  . General: Comfortable at this time . Eyes: Grossly normal lids, irises & conjunctiva . ENT: grossly tongue is normal . Neck: no obvious mass . Cardiovascular: S1 S2 normal no gallop . Respiratory: No rales or rhonchi noted . Abdomen: soft . Skin: no rash seen on limited exam . Musculoskeletal: not rigid . Psychiatric:unable to assess . Neurologic: no seizure no involuntary movements         Lab Data:   Basic Metabolic Panel: Recent Labs  Lab 11/14/19 0720  NA 138  K 4.0  CL 100  CO2 26  GLUCOSE 115*  BUN 15  CREATININE 0.54  CALCIUM 9.6    ABG: Recent Labs  Lab 11/18/19 1315  PHART 7.448  PCO2ART 42.0  PO2ART 84.6  HCO3 29.0*  O2SAT 97.8    Liver Function Tests: Recent Labs  Lab 11/14/19 0720  AST 44*  ALT 68*  ALKPHOS 125  BILITOT 0.3  PROT 5.3*  ALBUMIN 2.3*   No results for input(s): LIPASE, AMYLASE in the last 168 hours. No results for input(s): AMMONIA in the last 168 hours.  CBC: Recent Labs  Lab 11/14/19 0720  WBC 7.3  HGB 10.5*  HCT 33.3*  MCV 94.6  PLT 436*    Cardiac Enzymes: No results for input(s): CKTOTAL, CKMB, CKMBINDEX, TROPONINI in the last 168 hours.  BNP (last 3 results) No results for input(s): BNP in the last 8760 hours.  ProBNP (last 3 results) No results for input(s): PROBNP in the last 8760  hours.  Radiological Exams: No results found.  Assessment/Plan Active Problems:   Acute on chronic respiratory failure with hypoxia (HCC)   Healthcare-associated pneumonia   Nontraumatic subarachnoid hemorrhage (HCC)   Tracheostomy status (HCC)   1. Acute on chronic respiratory failure with hypoxia patient be downsized to #5 XLT cuffless trach today and continue with capping trials as tolerated PT supportive measures and pulmonary toilet.  Trials as tolerated continue secretion management pulmonary toilet 2. Healthcare associated pneumonia treated clinically improved 3. Nontraumatic intracranial hemorrhage no change 4. Tracheostomy remains in place   I have personally seen and evaluated the patient, evaluated laboratory and imaging results, formulated the assessment and plan and placed orders. The Patient requires high complexity decision making with multiple systems involvement.  Rounds were done with the Respiratory Therapy Director and Staff therapists and discussed with nursing staff also.  Yevonne Pax, MD [facility] Pulmonary Critical Care Medicine Sleep Medicine

## 2019-11-21 DIAGNOSIS — Z930 Tracheostomy status: Secondary | ICD-10-CM | POA: Diagnosis not present

## 2019-11-21 DIAGNOSIS — I609 Nontraumatic subarachnoid hemorrhage, unspecified: Secondary | ICD-10-CM | POA: Diagnosis not present

## 2019-11-21 DIAGNOSIS — J9621 Acute and chronic respiratory failure with hypoxia: Secondary | ICD-10-CM | POA: Diagnosis not present

## 2019-11-21 DIAGNOSIS — J189 Pneumonia, unspecified organism: Secondary | ICD-10-CM | POA: Diagnosis not present

## 2019-11-21 NOTE — Progress Notes (Signed)
Pulmonary Critical Care Medicine [facility] [place]   PULMONARY CRITICAL CARE SERVICE  PROGRESS NOTE  Date of Service: 11/21/2019  RUBY LOGIUDICE  [id number]  DOB: 20-Dec-1940   DOA: 11/09/2019  Referring Physician: Carron Curie, MD  HPI: VENUS GILLES is a 79 y.o. female seen for follow up of Acute on Chronic Respiratory Failure. Patient is capping out today is 24 hours doing actually quite well plan is going to be to continue with the capping trials.  Medications: Reviewed on Rounds  Physical Exam:  Vitals: Temperature 97.6 pulse 76 respiratory rate 20 blood pressure is 136/77 saturations 95%  Ventilator Settings capping 24 hours  . General: Comfortable at this time . Eyes: Grossly normal lids, irises & conjunctiva . ENT: grossly tongue is normal . Neck: no obvious mass . Cardiovascular: S1 S2 normal no gallop . Respiratory: No rhonchi coarse breath sounds are noted . Abdomen: soft . Skin: no rash seen on limited exam . Musculoskeletal: not rigid . Psychiatric:unable to assess . Neurologic: no seizure no involuntary movements         Lab Data:   Basic Metabolic Panel: No results for input(s): NA, K, CL, CO2, GLUCOSE, BUN, CREATININE, CALCIUM, MG, PHOS in the last 168 hours.  ABG: Recent Labs  Lab 11/18/19 1315  PHART 7.448  PCO2ART 42.0  PO2ART 84.6  HCO3 29.0*  O2SAT 97.8    Liver Function Tests: No results for input(s): AST, ALT, ALKPHOS, BILITOT, PROT, ALBUMIN in the last 168 hours. No results for input(s): LIPASE, AMYLASE in the last 168 hours. No results for input(s): AMMONIA in the last 168 hours.  CBC: No results for input(s): WBC, NEUTROABS, HGB, HCT, MCV, PLT in the last 168 hours.  Cardiac Enzymes: No results for input(s): CKTOTAL, CKMB, CKMBINDEX, TROPONINI in the last 168 hours.  BNP (last 3 results) No results for input(s): BNP in the last 8760 hours.  ProBNP (last 3 results) No results for input(s): PROBNP in the last  8760 hours.  Radiological Exams: DG ABDOMEN PEG TUBE LOCATION  Result Date: 11/20/2019 CLINICAL DATA:  Percutaneous gastrostomy tube placement. EXAM: ABDOMEN - 1 VIEW COMPARISON:  11/09/2019 FINDINGS: Evidence of patient's percutaneous gastrostomy tube over the upper abdomen just left of midline in adequate position. Small amount of contrast is seen within the stomach. No contrast extravasation. Bowel gas pattern is nonobstructive. 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 IMPRESSION: 1. Gastrostomy tube in adequate position over the stomach in the upper abdomen just left of midline. No contrast extravasation. 2.  Nonobstructive bowel gas pattern. Electronically Signed   By: Elberta Fortis M.D.   On: 11/20/2019 14:04    Assessment/Plan Active Problems:   Acute on chronic respiratory failure with hypoxia (HCC)   Healthcare-associated pneumonia   Nontraumatic subarachnoid hemorrhage (HCC)   Tracheostomy status (HCC)   1. Acute on chronic respiratory failure with hypoxia plan is to continue with the capping advance as tolerated working towards eventual decannulation 2. Healthcare associated pneumonia treated clinically is improving. 3. Nontraumatic subarachnoid hemorrhage no change 4. Tracheostomy remains in place   I have personally seen and evaluated the patient, evaluated laboratory and imaging results, formulated the assessment and plan and placed orders. The Patient requires high complexity decision making with multiple systems involvement.  Rounds were done with the Respiratory Therapy Director and Staff therapists and discussed with nursing staff also.  Yevonne Pax, MD [facility] Pulmonary Critical Care Medicine Sleep  Medicine

## 2019-11-22 DIAGNOSIS — J189 Pneumonia, unspecified organism: Secondary | ICD-10-CM | POA: Diagnosis not present

## 2019-11-22 DIAGNOSIS — Z930 Tracheostomy status: Secondary | ICD-10-CM | POA: Diagnosis not present

## 2019-11-22 DIAGNOSIS — J9621 Acute and chronic respiratory failure with hypoxia: Secondary | ICD-10-CM | POA: Diagnosis not present

## 2019-11-22 DIAGNOSIS — I609 Nontraumatic subarachnoid hemorrhage, unspecified: Secondary | ICD-10-CM | POA: Diagnosis not present

## 2019-11-22 LAB — BASIC METABOLIC PANEL
Anion gap: 11 (ref 5–15)
BUN: 11 mg/dL (ref 8–23)
CO2: 29 mmol/L (ref 22–32)
Calcium: 10.1 mg/dL (ref 8.9–10.3)
Chloride: 98 mmol/L (ref 98–111)
Creatinine, Ser: 0.52 mg/dL (ref 0.44–1.00)
GFR calc Af Amer: >60 mL/min (ref >60)
GFR calc non Af Amer: >60 mL/min (ref >60)
Glucose, Bld: 109 mg/dL — ABNORMAL HIGH (ref 70–99)
Potassium: 3.8 mmol/L (ref 3.5–5.1)
Sodium: 138 mmol/L (ref 135–145)

## 2019-11-22 LAB — CBC
HCT: 38.2 % (ref 36.0–46.0)
Hemoglobin: 12.1 g/dL (ref 12.0–15.0)
MCH: 29.4 pg (ref 26.0–34.0)
MCHC: 31.7 g/dL (ref 30.0–36.0)
MCV: 92.7 fL (ref 80.0–100.0)
Platelets: 388 10*3/uL (ref 150–400)
RBC: 4.12 MIL/uL (ref 3.87–5.11)
RDW: 13.6 % (ref 11.5–15.5)
WBC: 7.5 10*3/uL (ref 4.0–10.5)
nRBC: 0 % (ref 0.0–0.2)

## 2019-11-22 NOTE — Progress Notes (Signed)
Pulmonary Critical Care Medicine [facility] [place]   PULMONARY CRITICAL CARE SERVICE  PROGRESS NOTE  Date of Service: 11/22/2019  Stacy Dawson  DOB: 08/15/1941   DOA: 11/09/2019  Referring Physician: Carron Curie, MD  HPI: Stacy Dawson is a 79 y.o. female seen for follow up of Acute on Chronic Respiratory Failure.  At this time patient is capping and currently not requiring any oxygen patient will be completing 48 hours today.  Secretions are reportedly minimal requiring infrequent suctioning.  Medications: Reviewed on Rounds  Physical Exam:  Vitals: Temperature 97.5 pulse 84 respiratory rate 20 blood pressure is 169/77 saturations are 99%  Ventilator Settings on capping trials room air  . General: Comfortable at this time . Eyes: Grossly normal lids, irises & conjunctiva . ENT: grossly tongue is normal . Neck: no obvious mass . Cardiovascular: S1 S2 normal no gallop . Respiratory: Coarse breath sounds with a few rhonchi . Abdomen: soft . Skin: no rash seen on limited exam . Musculoskeletal: not rigid . Psychiatric:unable to assess . Neurologic: no seizure no involuntary movements         Lab Data:   Basic Metabolic Panel: Recent Labs  Lab 11/22/19 0637  NA 138  K 3.8  CL 98  CO2 29  GLUCOSE 109*  BUN 11  CREATININE 0.52  CALCIUM 10.1    ABG: Recent Labs  Lab 11/18/19 1315  PHART 7.448  PCO2ART 42.0  PO2ART 84.6  HCO3 29.0*  O2SAT 97.8    Liver Function Tests: No results for input(s): AST, ALT, ALKPHOS, BILITOT, PROT, ALBUMIN in the last 168 hours. No results for input(s): LIPASE, AMYLASE in the last 168 hours. No results for input(s): AMMONIA in the last 168 hours.  CBC: Recent Labs  Lab 11/22/19 0637  WBC 7.5  HGB 12.1  HCT 38.2  MCV 92.7  PLT 388    Cardiac Enzymes: No results for input(s): CKTOTAL, CKMB, CKMBINDEX, TROPONINI in the last 168 hours.  BNP (last 3 results) No results for input(s): BNP  in the last 8760 hours.  ProBNP (last 3 results) No results for input(s): PROBNP in the last 8760 hours.  Radiological Exams: No results found.  Assessment/Plan Active Problems:   Acute on chronic respiratory failure with hypoxia (HCC)   Healthcare-associated pneumonia   Nontraumatic subarachnoid hemorrhage (HCC)   Tracheostomy status (HCC)   1. Acute on chronic respiratory failure with hypoxia plan is to continue with capping studies continue pulmonary toilet supportive care. 2. Healthcare associated pneumonia treated clinically resolving 3. Nontraumatic intracranial hemorrhage no improvement no change 4. Tracheostomy has been doing well with capping which will be continued   I have personally seen and evaluated the patient, evaluated laboratory and imaging results, formulated the assessment and plan and placed orders. The Patient requires high complexity decision making with multiple systems involvement.  Rounds were done with the Respiratory Therapy Director and Staff therapists and discussed with nursing staff also.  Yevonne Pax, MD [facility] Pulmonary Critical Care Medicine Sleep Medicine

## 2019-11-23 DIAGNOSIS — I609 Nontraumatic subarachnoid hemorrhage, unspecified: Secondary | ICD-10-CM | POA: Diagnosis not present

## 2019-11-23 DIAGNOSIS — J9621 Acute and chronic respiratory failure with hypoxia: Secondary | ICD-10-CM | POA: Diagnosis not present

## 2019-11-23 DIAGNOSIS — J189 Pneumonia, unspecified organism: Secondary | ICD-10-CM | POA: Diagnosis not present

## 2019-11-23 DIAGNOSIS — Z930 Tracheostomy status: Secondary | ICD-10-CM | POA: Diagnosis not present

## 2019-11-23 NOTE — Progress Notes (Addendum)
Pulmonary Critical Care Medicine [facility] [place]   PULMONARY CRITICAL CARE SERVICE  PROGRESS NOTE  Date of Service: 11/23/2019  Stacy Dawson  FXT:[id number]  DOB: August 08, 1941   DOA: 11/09/2019  Referring Physician: Carron Curie, MD  HPI: Stacy Dawson is a 79 y.o. female seen for follow up of Acute on Chronic Respiratory Failure.  Patient was decannulated today remains on room air at this time satting well no fever distress.  Medications: Reviewed on Rounds  Physical Exam:  Vitals: Pulse 80 respirations 22 BP 123/79 O2 sat 90% temp 97.6  Ventilator Settings room air  . General: Comfortable at this time . Eyes: Grossly normal lids, irises & conjunctiva . ENT: grossly tongue is normal . Neck: no obvious mass . Cardiovascular: S1 S2 normal no gallop . Respiratory: No rales or rhonchi noted . Abdomen: soft . Skin: no rash seen on limited exam . Musculoskeletal: not rigid . Psychiatric:unable to assess . Neurologic: no seizure no involuntary movements         Lab Data:   Basic Metabolic Panel: Recent Labs  Lab 11/22/19 0637  NA 138  K 3.8  CL 98  CO2 29  GLUCOSE 109*  BUN 11  CREATININE 0.52  CALCIUM 10.1    ABG: Recent Labs  Lab 11/18/19 1315  PHART 7.448  PCO2ART 42.0  PO2ART 84.6  HCO3 29.0*  O2SAT 97.8    Liver Function Tests: No results for input(s): AST, ALT, ALKPHOS, BILITOT, PROT, ALBUMIN in the last 168 hours. No results for input(s): LIPASE, AMYLASE in the last 168 hours. No results for input(s): AMMONIA in the last 168 hours.  CBC: Recent Labs  Lab 11/22/19 0637  WBC 7.5  HGB 12.1  HCT 38.2  MCV 92.7  PLT 388    Cardiac Enzymes: No results for input(s): CKTOTAL, CKMB, CKMBINDEX, TROPONINI in the last 168 hours.  BNP (last 3 results) No results for input(s): BNP in the last 8760 hours.  ProBNP (last 3 results) No results for input(s): PROBNP in the last 8760 hours.  Radiological Exams: No results  found.  Assessment/Plan Active Problems:   Acute on chronic respiratory failure with hypoxia (HCC)   Healthcare-associated pneumonia   Nontraumatic subarachnoid hemorrhage (HCC)   Tracheostomy status (HCC)   1. Acute on chronic respiratory failure with hypoxia patient was decannulated today remains on room next time continue to monitor and continue supportive measures and pulmonary toilet. 2. Healthcare associated pneumonia treated clinically resolving 3. Nontraumatic intracranial hemorrhage no improvement no change 4. Tracheostomy decannulated today continue to monitor.   I have personally seen and evaluated the patient, evaluated laboratory and imaging results, formulated the assessment and plan and placed orders. The Patient requires high complexity decision making with multiple systems involvement.  Rounds were done with the Respiratory Therapy Director and Staff therapists and discussed with nursing staff also.  Yevonne Pax, MD [facility] Pulmonary Critical Care Medicine Sleep Medicine

## 2019-11-24 DIAGNOSIS — I609 Nontraumatic subarachnoid hemorrhage, unspecified: Secondary | ICD-10-CM | POA: Diagnosis not present

## 2019-11-24 DIAGNOSIS — Z930 Tracheostomy status: Secondary | ICD-10-CM | POA: Diagnosis not present

## 2019-11-24 DIAGNOSIS — J189 Pneumonia, unspecified organism: Secondary | ICD-10-CM | POA: Diagnosis not present

## 2019-11-24 DIAGNOSIS — J9621 Acute and chronic respiratory failure with hypoxia: Secondary | ICD-10-CM | POA: Diagnosis not present

## 2019-11-24 NOTE — Progress Notes (Signed)
Pulmonary Critical Care Medicine [facility] [place]   PULMONARY CRITICAL CARE SERVICE  PROGRESS NOTE  Date of Service: 11/24/2019  Stacy Dawson  WUJ:[id number]  DOB: 1941-06-06   DOA: 11/09/2019  Referring Physician: Carron Curie, MD  HPI: Stacy Dawson is a 79 y.o. female seen for follow up of Acute on Chronic Respiratory Failure.  Patient is currently on room air and was decannulated Treatments patient is a   Medications: Reviewed on Rounds  Physical Exam:  Vitals: Temperature is 98.0 pulse 88 respiratory 20 blood pressure is 151/98 saturations 98%  Ventilator Settings on room air  . General: Comfortable at this time . Eyes: Grossly normal lids, irises & conjunctiva . ENT: grossly tongue is normal . Neck: no obvious mass . Cardiovascular: S1 S2 normal no gallop . Respiratory: No rhonchi no rales noted at this time . Abdomen: soft . Skin: no rash seen on limited exam . Musculoskeletal: not rigid . Psychiatric:unable to assess . Neurologic: no seizure no involuntary movements         Lab Data:   Basic Metabolic Panel: Recent Labs  Lab 11/22/19 0637  NA 138  K 3.8  CL 98  CO2 29  GLUCOSE 109*  BUN 11  CREATININE 0.52  CALCIUM 10.1    ABG: Recent Labs  Lab 11/18/19 1315  PHART 7.448  PCO2ART 42.0  PO2ART 84.6  HCO3 29.0*  O2SAT 97.8    Liver Function Tests: No results for input(s): AST, ALT, ALKPHOS, BILITOT, PROT, ALBUMIN in the last 168 hours. No results for input(s): LIPASE, AMYLASE in the last 168 hours. No results for input(s): AMMONIA in the last 168 hours.  CBC: Recent Labs  Lab 11/22/19 0637  WBC 7.5  HGB 12.1  HCT 38.2  MCV 92.7  PLT 388    Cardiac Enzymes: No results for input(s): CKTOTAL, CKMB, CKMBINDEX, TROPONINI in the last 168 hours.  BNP (last 3 results) No results for input(s): BNP in the last 8760 hours.  ProBNP (last 3 results) No results for input(s): PROBNP in the last 8760 hours.  Radiological  Exams: No results found.  Assessment/Plan Active Problems:   Acute on chronic respiratory failure with hypoxia (HCC)   Healthcare-associated pneumonia   Nontraumatic subarachnoid hemorrhage (HCC)   Tracheostomy status (HCC)   1. Acute on chronic respiratory failure hypoxia doing well after decannulation we'll continue with supportive care 2. Healthcare associated pneumonia treated improving 3. Nontraumatic intracranial hemorrhage no change 4. Tracheostomy was removed yesterday   I have personally seen and evaluated the patient, evaluated laboratory and imaging results, formulated the assessment and plan and placed orders. The Patient requires high complexity decision making with multiple systems involvement.  Rounds were done with the Respiratory Therapy Director and Staff therapists and discussed with nursing staff also.  Yevonne Pax, MD [facility] Pulmonary Critical Care Medicine Sleep Medicine

## 2019-11-25 ENCOUNTER — Other Ambulatory Visit (HOSPITAL_COMMUNITY): Payer: Medicaid Other

## 2019-11-25 HISTORY — PX: IR REPLC GASTRO/COLONIC TUBE PERCUT W/FLUORO: IMG2333

## 2019-11-25 NOTE — Procedures (Signed)
Interventional Radiology Procedure Note  Procedure: Rescue of gtube, with new 24F balloon retention gastrostomy tube. Complications: None Recommendations: - OK to use  Signed,   Yvone Neu. Loreta Ave, DO

## 2019-11-28 LAB — CBC
HCT: 37.8 % (ref 36.0–46.0)
Hemoglobin: 11.9 g/dL — ABNORMAL LOW (ref 12.0–15.0)
MCH: 29.6 pg (ref 26.0–34.0)
MCHC: 31.5 g/dL (ref 30.0–36.0)
MCV: 94 fL (ref 80.0–100.0)
Platelets: 338 10*3/uL (ref 150–400)
RBC: 4.02 MIL/uL (ref 3.87–5.11)
RDW: 13.8 % (ref 11.5–15.5)
WBC: 7.6 10*3/uL (ref 4.0–10.5)
nRBC: 0 % (ref 0.0–0.2)

## 2019-11-28 LAB — BASIC METABOLIC PANEL
Anion gap: 8 (ref 5–15)
BUN: 20 mg/dL (ref 8–23)
CO2: 28 mmol/L (ref 22–32)
Calcium: 9.9 mg/dL (ref 8.9–10.3)
Chloride: 103 mmol/L (ref 98–111)
Creatinine, Ser: 0.65 mg/dL (ref 0.44–1.00)
GFR calc Af Amer: >60 mL/min (ref >60)
GFR calc non Af Amer: >60 mL/min (ref >60)
Glucose, Bld: 133 mg/dL — ABNORMAL HIGH (ref 70–99)
Potassium: 4.2 mmol/L (ref 3.5–5.1)
Sodium: 139 mmol/L (ref 135–145)

## 2019-11-29 LAB — NOVEL CORONAVIRUS, NAA (HOSP ORDER, SEND-OUT TO REF LAB; TAT 18-24 HRS): SARS-CoV-2, NAA: NOT DETECTED

## 2019-12-01 ENCOUNTER — Other Ambulatory Visit (HOSPITAL_COMMUNITY): Payer: Medicaid Other

## 2019-12-03 LAB — CBC
HCT: 38.2 % (ref 36.0–46.0)
Hemoglobin: 12.3 g/dL (ref 12.0–15.0)
MCH: 29.6 pg (ref 26.0–34.0)
MCHC: 32.2 g/dL (ref 30.0–36.0)
MCV: 91.8 fL (ref 80.0–100.0)
Platelets: 318 10*3/uL (ref 150–400)
RBC: 4.16 MIL/uL (ref 3.87–5.11)
RDW: 13.7 % (ref 11.5–15.5)
WBC: 7.4 10*3/uL (ref 4.0–10.5)
nRBC: 0 % (ref 0.0–0.2)

## 2019-12-03 LAB — BASIC METABOLIC PANEL
Anion gap: 9 (ref 5–15)
BUN: 17 mg/dL (ref 8–23)
CO2: 29 mmol/L (ref 22–32)
Calcium: 10 mg/dL (ref 8.9–10.3)
Chloride: 104 mmol/L (ref 98–111)
Creatinine, Ser: 0.65 mg/dL (ref 0.44–1.00)
GFR calc Af Amer: >60 mL/min (ref >60)
GFR calc non Af Amer: >60 mL/min (ref >60)
Glucose, Bld: 108 mg/dL — ABNORMAL HIGH (ref 70–99)
Potassium: 3.8 mmol/L (ref 3.5–5.1)
Sodium: 142 mmol/L (ref 135–145)

## 2019-12-04 LAB — SARS CORONAVIRUS 2 (TAT 6-24 HRS): SARS Coronavirus 2: NEGATIVE

## 2020-08-28 IMAGING — DX DG CHEST 1V PORT
1 series · 1 of 1 positions shown · IV contrast (omnipaque)
Comparison: none

CLINICAL DATA: Trach,respiratory failure^50mL OMNIPAQUE IOHEXOL
300 MG/ML SOLN

EXAM:
PORTABLE CHEST - 1 VIEW

[chest ap]
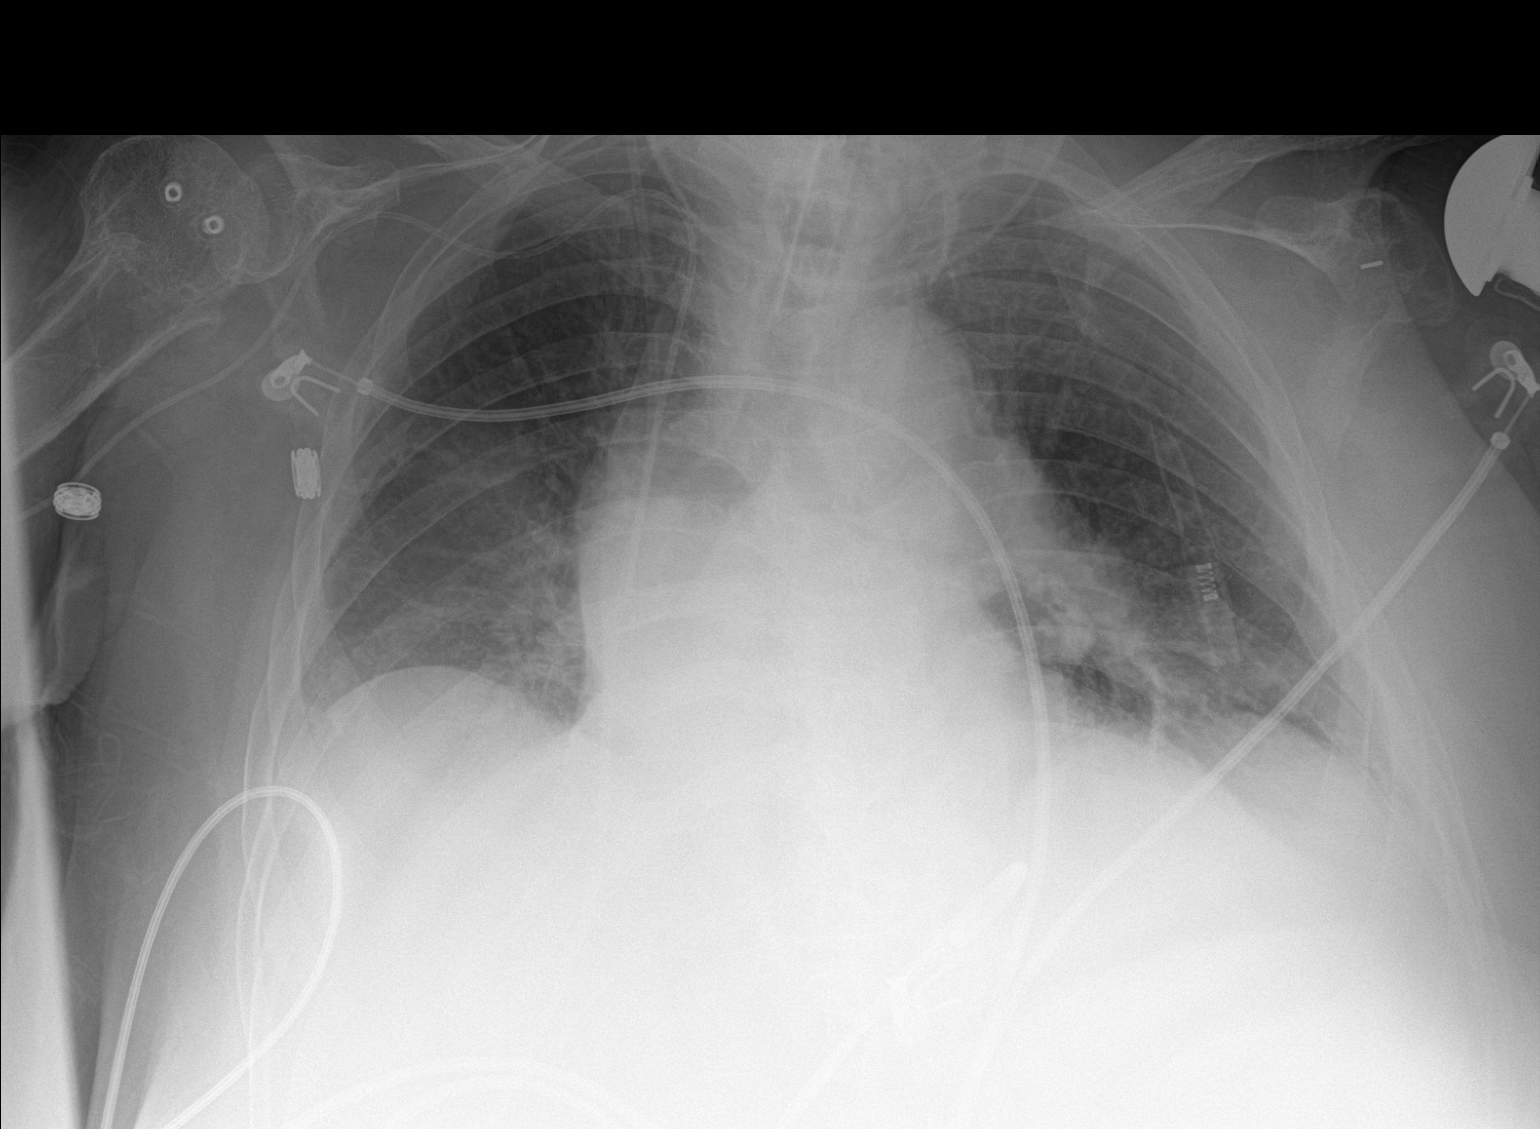

[1 of 1 positions shown; findings below may reference images not displayed]

FINDINGS: Tracheostomy tip 3.8 cm above carina. Right arm PICC to the
cavoatrial junction. Presumed VP shunt tubing projects over the
right hemithorax.

Low lung volumes. Patchy interstitial and airspace opacities in the
lung bases left greater than right.

Heart size upper limits normal for technique. Blunting of the right
lateral costophrenic angle. No pneumothorax.

Left shoulder arthroplasty hardware partially visualized. Fracture
deformity of the proximal right humerus.
IMPRESSION: Bibasilar opacities left greater than right, with possible small
right pleural effusion.

## 2020-09-13 IMAGING — DX DG ABDOMEN 1V
2 series · 2 of 2 positions shown · non-contrast
Comparison: 11/20/2019

CLINICAL DATA: Abdominal pain.

EXAM:
ABDOMEN - 1 VIEW

[abdomen kub (1 of 2)]
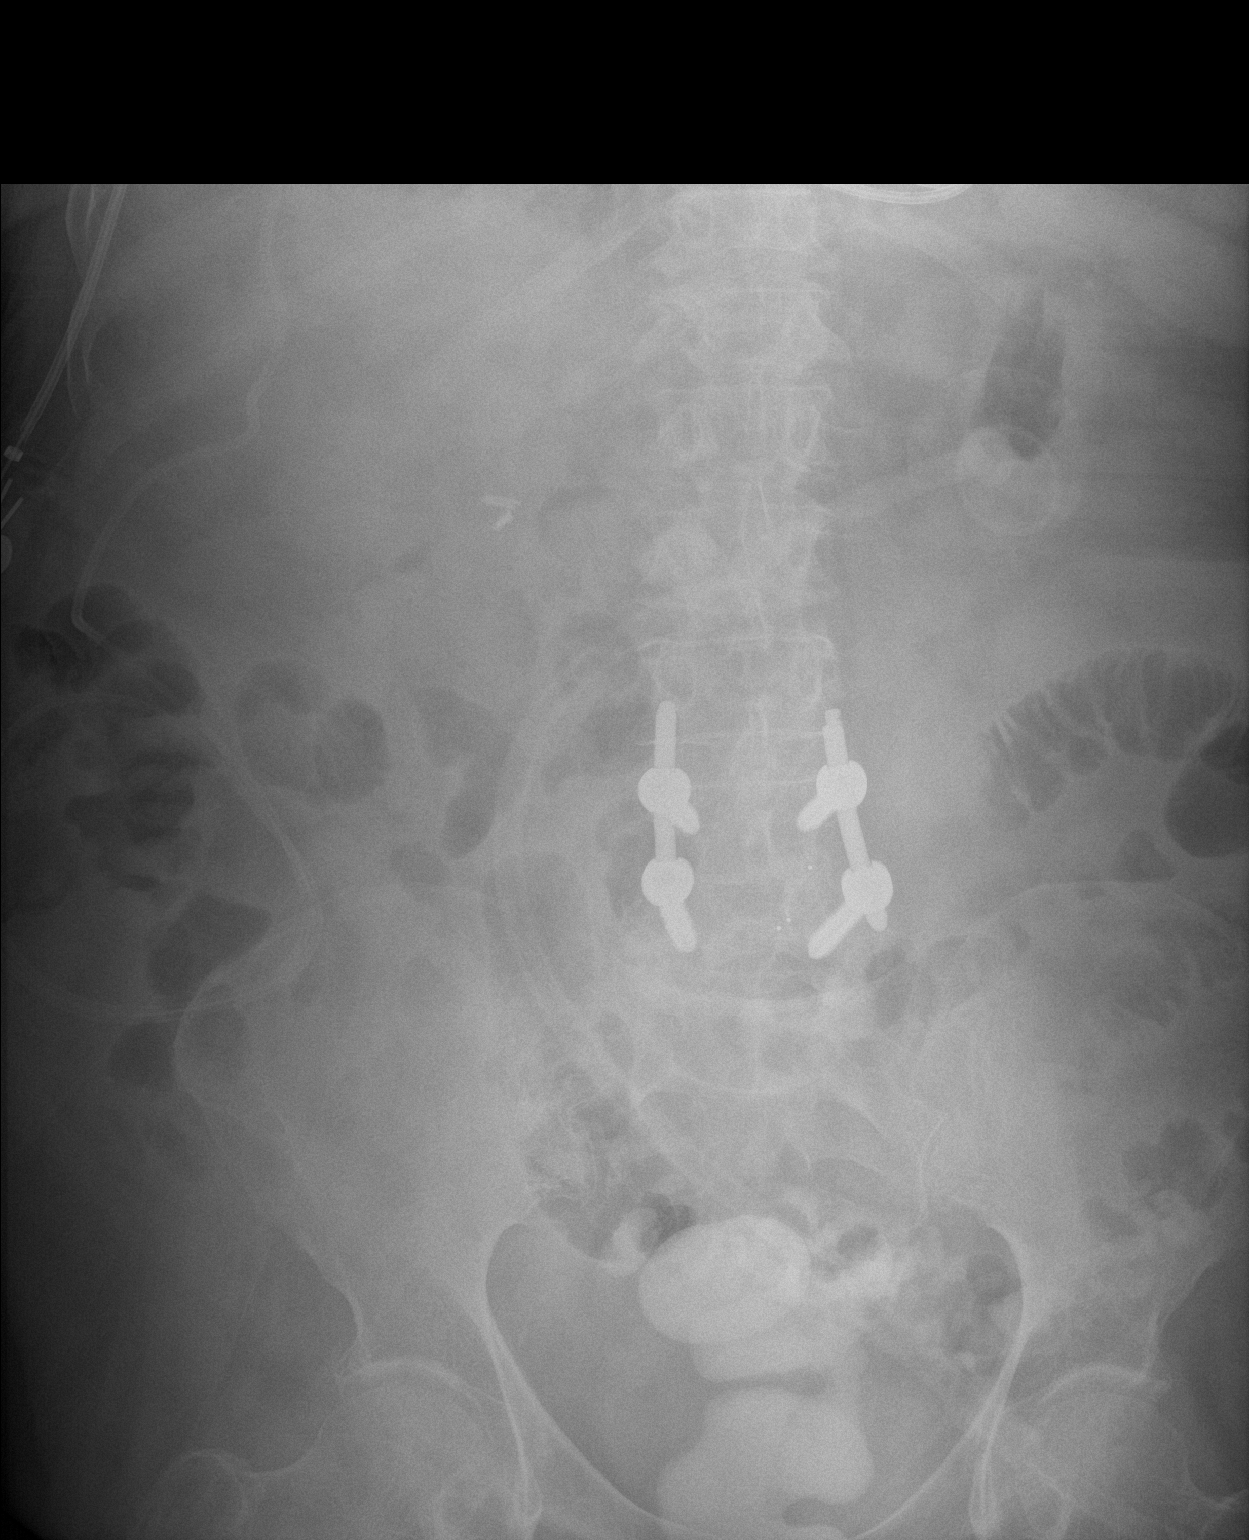

[abdomen kub (2 of 2)]
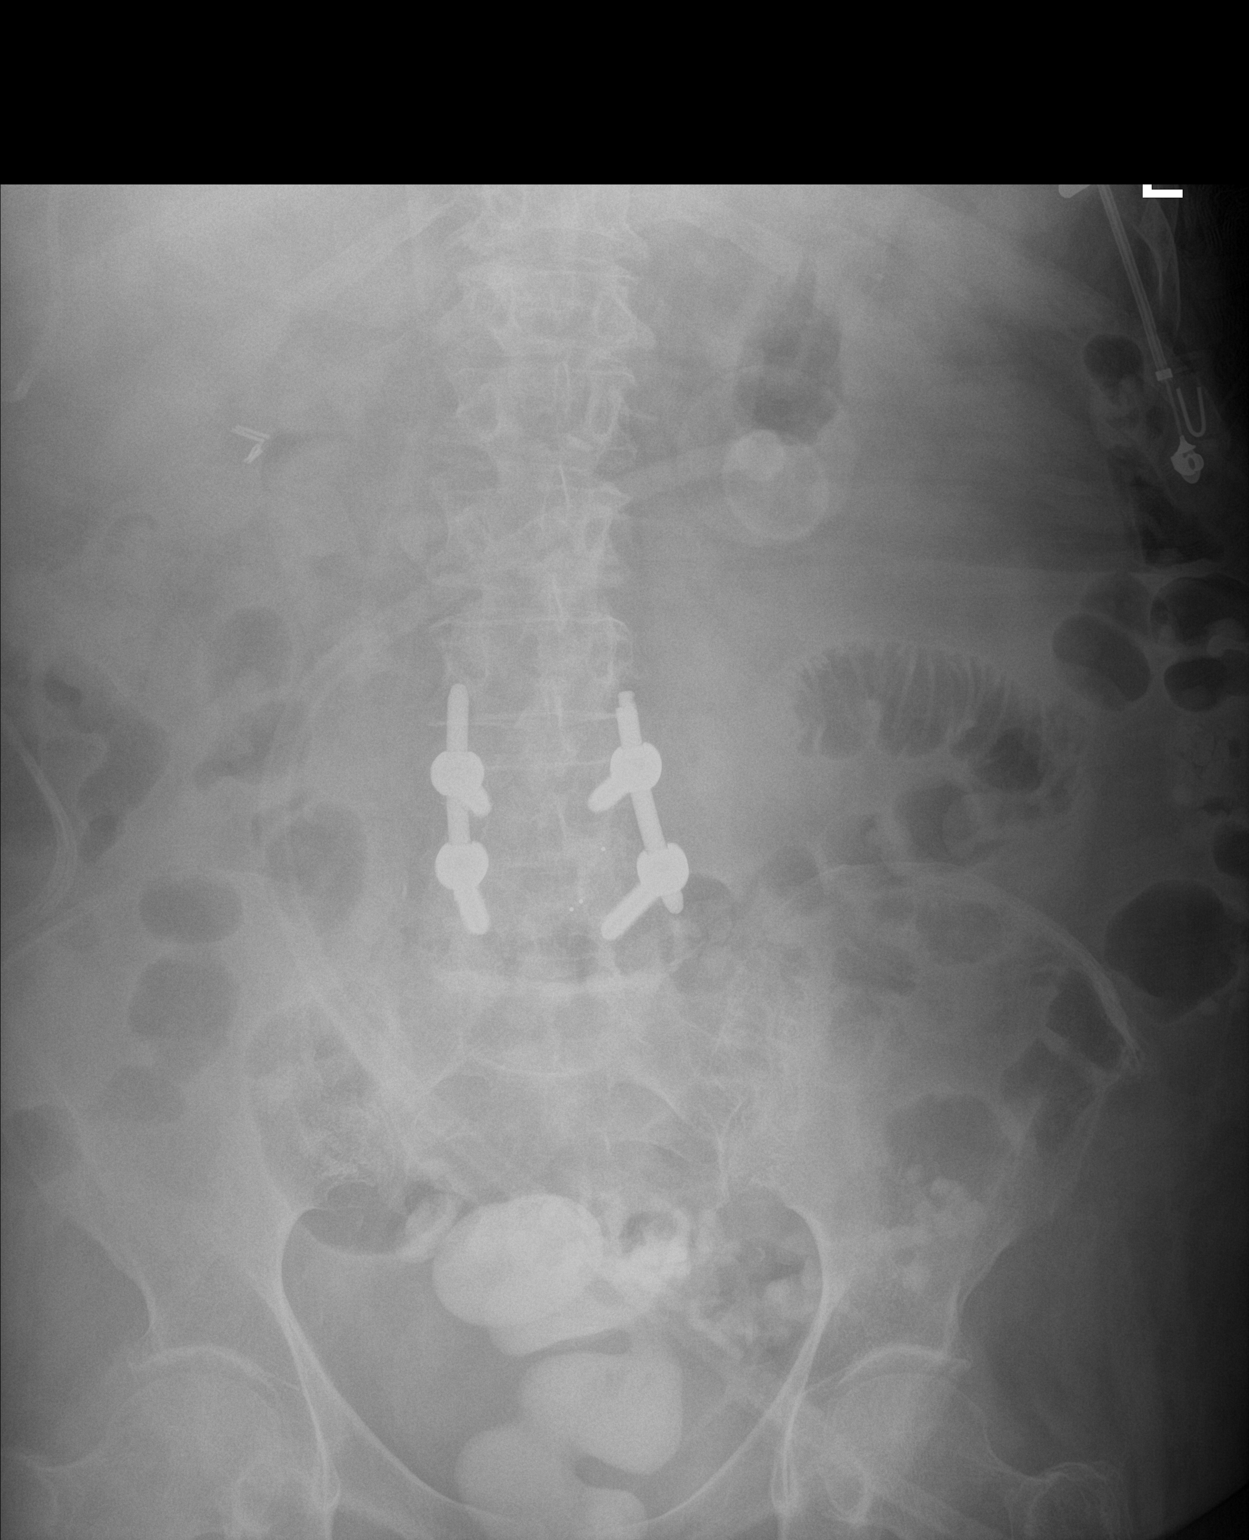

[2 of 2 positions shown; findings below may reference images not displayed]

FINDINGS: Stable position of the feeding gastrostomy tube in the left upper
quadrant.

The bowel gas pattern is unremarkable. No findings for obstruction
or perforation.

Residual contrast is noted in the sigmoid colon and rectum.
Significant sigmoid colon diverticulosis.

The bony structures are intact. Stable lumbar fusion hardware.
IMPRESSION: No plain film findings for an acute abdominal process.
